# Patient Record
Sex: Female | Born: 1990 | Race: White | Hispanic: No | Smoking: Never smoker
Health system: Southern US, Community
[De-identification: ages and names within clinical notes are randomized; demographics above are authoritative.]

## PROBLEM LIST (undated history)

## (undated) DIAGNOSIS — L4050 Arthropathic psoriasis, unspecified: Secondary | ICD-10-CM

## (undated) HISTORY — PX: ROUX-EN-Y GASTRIC BYPASS: SHX1104

## (undated) HISTORY — PX: FOOT SURGERY: SHX648

---

## 2018-05-31 ENCOUNTER — Encounter (HOSPITAL_BASED_OUTPATIENT_CLINIC_OR_DEPARTMENT_OTHER): Payer: Self-pay | Admitting: Emergency Medicine

## 2018-05-31 ENCOUNTER — Emergency Department (HOSPITAL_BASED_OUTPATIENT_CLINIC_OR_DEPARTMENT_OTHER)
Admission: EM | Admit: 2018-05-31 | Discharge: 2018-05-31 | Disposition: A | Discharge status: Home / Self Care | Payer: Self-pay | Attending: Emergency Medicine | Admitting: Emergency Medicine

## 2018-05-31 ENCOUNTER — Emergency Department (HOSPITAL_BASED_OUTPATIENT_CLINIC_OR_DEPARTMENT_OTHER): Payer: Self-pay

## 2018-05-31 ENCOUNTER — Other Ambulatory Visit: Payer: Self-pay

## 2018-05-31 DIAGNOSIS — R059 Cough, unspecified: Secondary | ICD-10-CM

## 2018-05-31 DIAGNOSIS — R1013 Epigastric pain: Secondary | ICD-10-CM | POA: Insufficient documentation

## 2018-05-31 DIAGNOSIS — A599 Trichomoniasis, unspecified: Secondary | ICD-10-CM | POA: Insufficient documentation

## 2018-05-31 DIAGNOSIS — R110 Nausea: Secondary | ICD-10-CM | POA: Insufficient documentation

## 2018-05-31 DIAGNOSIS — R05 Cough: Secondary | ICD-10-CM | POA: Insufficient documentation

## 2018-05-31 LAB — URINALYSIS, MICROSCOPIC (REFLEX): WBC, UA: >50 WBC/hpf (ref 0–5)

## 2018-05-31 LAB — URINALYSIS, ROUTINE W REFLEX MICROSCOPIC
Bilirubin Urine: NEGATIVE
GLUCOSE, UA: NEGATIVE mg/dL
Hgb urine dipstick: NEGATIVE
KETONES UR: 15 mg/dL — AB
Nitrite: NEGATIVE
PH: 6 (ref 5.0–8.0)
PROTEIN: NEGATIVE mg/dL
Specific Gravity, Urine: >1.03 — ABNORMAL HIGH (ref 1.005–1.030)

## 2018-05-31 LAB — CBC WITH DIFFERENTIAL/PLATELET
Abs Immature Granulocytes: 0.03 10*3/uL (ref 0.00–0.07)
Basophils Absolute: 0 10*3/uL (ref 0.0–0.1)
Basophils Relative: 0 %
Eosinophils Absolute: 0 10*3/uL (ref 0.0–0.5)
Eosinophils Relative: 0 %
HCT: 35.3 % — ABNORMAL LOW (ref 36.0–46.0)
Hemoglobin: 10.4 g/dL — ABNORMAL LOW (ref 12.0–15.0)
Immature Granulocytes: 0 %
Lymphocytes Relative: 13 %
Lymphs Abs: 1 10*3/uL (ref 0.7–4.0)
MCH: 24.9 pg — ABNORMAL LOW (ref 26.0–34.0)
MCHC: 29.5 g/dL — ABNORMAL LOW (ref 30.0–36.0)
MCV: 84.7 fL (ref 80.0–100.0)
Monocytes Absolute: 0.5 10*3/uL (ref 0.1–1.0)
Monocytes Relative: 7 %
Neutro Abs: 5.9 10*3/uL (ref 1.7–7.7)
Neutrophils Relative %: 80 %
Platelets: 179 10*3/uL (ref 150–400)
RBC: 4.17 MIL/uL (ref 3.87–5.11)
RDW: 14.7 % (ref 11.5–15.5)
WBC: 7.4 10*3/uL (ref 4.0–10.5)
nRBC: 0 % (ref 0.0–0.2)

## 2018-05-31 LAB — COMPREHENSIVE METABOLIC PANEL
ALT: 17 U/L (ref 0–44)
AST: 21 U/L (ref 15–41)
Albumin: 3.7 g/dL (ref 3.5–5.0)
Alkaline Phosphatase: 59 U/L (ref 38–126)
Anion gap: 7 (ref 5–15)
BUN: 12 mg/dL (ref 6–20)
CO2: 23 mmol/L (ref 22–32)
CREATININE: 0.46 mg/dL (ref 0.44–1.00)
Calcium: 8.4 mg/dL — ABNORMAL LOW (ref 8.9–10.3)
Chloride: 104 mmol/L (ref 98–111)
GFR calc Af Amer: >60 mL/min (ref >60)
GFR calc non Af Amer: >60 mL/min (ref >60)
Glucose, Bld: 107 mg/dL — ABNORMAL HIGH (ref 70–99)
Potassium: 3.8 mmol/L (ref 3.5–5.1)
Sodium: 134 mmol/L — ABNORMAL LOW (ref 135–145)
Total Bilirubin: 1.1 mg/dL (ref 0.3–1.2)
Total Protein: 7.2 g/dL (ref 6.5–8.1)

## 2018-05-31 LAB — LIPASE, BLOOD: Lipase: 26 U/L (ref 11–51)

## 2018-05-31 LAB — WET PREP, GENITAL
SPERM: NONE SEEN
YEAST WET PREP: NONE SEEN

## 2018-05-31 LAB — PREGNANCY, URINE: Preg Test, Ur: NEGATIVE

## 2018-05-31 MED ORDER — SODIUM CHLORIDE 0.9 % IV BOLUS
1000.0000 mL | Freq: Once | INTRAVENOUS | Status: AC
  Administered 2018-05-31: 1000 mL via INTRAVENOUS

## 2018-05-31 MED ORDER — METRONIDAZOLE 500 MG PO TABS
2000.0000 mg | ORAL_TABLET | Freq: Once | ORAL | Status: AC
  Administered 2018-05-31: 2000 mg via ORAL
  Filled 2018-05-31: qty 4

## 2018-05-31 MED ORDER — ONDANSETRON HCL 4 MG/2ML IJ SOLN
4.0000 mg | Freq: Once | INTRAMUSCULAR | Status: AC
  Administered 2018-05-31: 4 mg via INTRAVENOUS
  Filled 2018-05-31: qty 2

## 2018-05-31 MED ORDER — DICYCLOMINE HCL 20 MG PO TABS: 20.0000 mg | ORAL_TABLET | Freq: Three times a day (TID) | ORAL | 0 refills | Status: AC | PRN

## 2018-05-31 MED ORDER — ONDANSETRON 4 MG PO TBDP: 4.0000 mg | ORAL_TABLET | Freq: Three times a day (TID) | ORAL | 0 refills | Status: AC | PRN

## 2018-05-31 MED ORDER — ACETAMINOPHEN 500 MG PO TABS
1000.0000 mg | ORAL_TABLET | Freq: Once | ORAL | Status: AC
  Administered 2018-05-31: 1000 mg via ORAL
  Filled 2018-05-31: qty 2

## 2018-05-31 NOTE — ED Notes (Signed)
Pt verbalizes understanding of d/c instructions and denies any further needs at this time. 

## 2018-05-31 NOTE — ED Notes (Signed)
Dr. Jacqulyn Bath states it is ok for pt to take her own hydrocodone

## 2018-05-31 NOTE — ED Provider Notes (Signed)
Emergency Department Provider Note   I have reviewed the triage vital signs and the nursing notes.   HISTORY  Chief Complaint Dehydration   HPI Carol Mcneil is a 28 y.o. female with PMH of Roux-en-Y gastric bypass in 2017 presents to the emergency department with fatigue, cough, fever, epigastric pain, nausea.  Patient has not experienced any vomiting or diarrhea.  Symptoms are mainly with coughing.  She has had mild headache but no sore throat.  No known flu contacts.  Her abdominal pain is epigastric and left upper quadrant.  No radiation of symptoms.  No complications from her gastric bypass to date.  Tums have been intermittent for the past 2 weeks but worsening significantly over the past 2 days.  Denies any lower abdominal pain, vaginal discharge, vaginal bleeding.  She does note frequent itching in the vaginal area.    History reviewed. No pertinent past medical history.  There are no active problems to display for this patient.  Allergies Other and Vancomycin  History reviewed. No pertinent family history.  Social History Social History   Tobacco Use  . Smoking status: Not on file  Substance Use Topics  . Alcohol use: Not on file  . Drug use: Not on file    Review of Systems  Constitutional: Postive fever and fatigue.  Eyes: No visual changes. ENT: Mild sore throat. Cardiovascular: Denies chest pain. Respiratory: Denies shortness of breath. Positive cough.  Gastrointestinal: Positive epigastric abdominal pain.  Positive nausea, no vomiting.  No diarrhea.  No constipation. Genitourinary: Negative for dysuria. Positive vaginal itching.  Musculoskeletal: Negative for back pain. Skin: Negative for rash. Neurological: Negative for focal weakness or numbness. Positive HA.   10-point ROS otherwise negative.  ____________________________________________   PHYSICAL EXAM:  VITAL SIGNS: ED Triage Vitals  Enc Vitals Group     BP 05/31/18 1855 (!) 151/97   Pulse Rate 05/31/18 1855 (!) 128     Resp 05/31/18 1855 20     Temp 05/31/18 1855 99.2 F (37.3 C)     Temp Source 05/31/18 1855 Oral     SpO2 05/31/18 1855 100 %     Weight 05/31/18 1854 192 lb (87.1 kg)     Height 05/31/18 1854 5\' 4"  (1.626 m)     Pain Score 05/31/18 1858 8   Constitutional: Alert and oriented. Well appearing and in no acute distress. Eyes: Conjunctivae are normal. PERRL.  Head: Atraumatic. Nose: Mild congestion/rhinnorhea. Mouth/Throat: Mucous membranes are moist.  Oropharynx non-erythematous. Neck: No stridor.  No meningeal signs. Cardiovascular: Tachycardia. Good peripheral circulation. Grossly normal heart sounds.   Respiratory: Normal respiratory effort.  No retractions. Lungs CTAB. Gastrointestinal: Soft with mild epigastric tenderness. No distention.  Genitourinary: Pelvic exam performed with nurse chaperone. Moderate discharge. No CMT or adnexal tenderness/fullness.  Musculoskeletal: No lower extremity tenderness nor edema. No gross deformities of extremities. Neurologic:  Normal speech and language. No gross focal neurologic deficits are appreciated.  Skin:  Skin is warm, dry and intact. No rash noted.   ____________________________________________   LABS (all labs ordered are listed, but only abnormal results are displayed)  Labs Reviewed  WET PREP, GENITAL - Abnormal; Notable for the following components:      Result Value   Trich, Wet Prep PRESENT (*)    Clue Cells Wet Prep HPF POC PRESENT (*)    WBC, Wet Prep HPF POC MANY (*)    All other components within normal limits  URINALYSIS, ROUTINE W REFLEX MICROSCOPIC - Abnormal; Notable  for the following components:   APPearance CLOUDY (*)    Specific Gravity, Urine >1.030 (*)    Ketones, ur 15 (*)    Leukocytes,Ua MODERATE (*)    All other components within normal limits  URINALYSIS, MICROSCOPIC (REFLEX) - Abnormal; Notable for the following components:   Bacteria, UA FEW (*)    Trichomonas, UA  PRESENT (*)    All other components within normal limits  COMPREHENSIVE METABOLIC PANEL - Abnormal; Notable for the following components:   Sodium 134 (*)    Glucose, Bld 107 (*)    Calcium 8.4 (*)    All other components within normal limits  CBC WITH DIFFERENTIAL/PLATELET - Abnormal; Notable for the following components:   Hemoglobin 10.4 (*)    HCT 35.3 (*)    MCH 24.9 (*)    MCHC 29.5 (*)    All other components within normal limits  URINE CULTURE  PREGNANCY, URINE  LIPASE, BLOOD  HIV ANTIBODY (ROUTINE TESTING W REFLEX)  GC/CHLAMYDIA PROBE AMP ([place]) NOT AT [facility]   ____________________________________________  RADIOLOGY  Dg Chest 2 View  Result Date: 05/31/2018 CLINICAL DATA:  Cough, fever EXAM: CHEST - 2 VIEW COMPARISON:  None. FINDINGS: The heart size and mediastinal contours are within normal limits. Both lungs are clear. The visualized skeletal structures are unremarkable. IMPRESSION: No active cardiopulmonary disease. Electronically Signed   By: Charlett Nose M.D.   On: 05/31/2018 21:14    ____________________________________________   PROCEDURES  Procedure(s) performed:   Procedures  None ____________________________________________   INITIAL IMPRESSION / ASSESSMENT AND PLAN / ED COURSE  Pertinent labs & imaging results that were available during my care of the patient were reviewed by me and considered in my medical decision making (see chart for details).  Patient presents to the emergency department with epigastric abdominal pain, nausea, cough.  Borderline fever here with mild tachycardia.  Patient has a nontender abdominal exam.  Urinalysis shows possible infection but also trichomonas.  I discussed this with the patient with her partner outside of the room.  She reports of frequent BV infections but no prior history of trichomonas.  Plan for pelvic exam.  Following chest x-ray and will reassess.   Chest x-ray with no acute infiltrate.  Patient does  have mild tachycardia and fever but no concern clinically for sepsis.  UA shows moderate leukocytes and bacteria but suspect that this is from her active trichomonas infection which was also found on UA.  Pelvic exam completed which is consistent with trichomonas on wet prep.  No cervical motion tenderness or concern for pelvic inflammatory disease or tubo-ovarian abscess.  The patient's abdomen is soft and nontender.  The patient has not having any UTI symptoms such as dysuria, hesitancy, urgency.  I offered treatment for gonorrhea and chlamydia but patient would like to wait on testing.  She informed her fianc, who is in the room, and advised that he be treated as well. Low suspicion for flu and patient is outside the window to benefit from Tamiflu.  ____________________________________________  FINAL CLINICAL IMPRESSION(S) / ED DIAGNOSES  Final diagnoses:  Nausea  Epigastric abdominal pain  Cough  Trichimoniasis     MEDICATIONS GIVEN DURING THIS VISIT:  Medications  sodium chloride 0.9 % bolus 1,000 mL (0 mLs Intravenous Stopped 05/31/18 2135)  ondansetron (ZOFRAN) injection 4 mg (4 mg Intravenous Given 05/31/18 2027)  metroNIDAZOLE (FLAGYL) tablet 2,000 mg (2,000 mg Oral Given 05/31/18 2043)  acetaminophen (TYLENOL) tablet 1,000 mg (1,000 mg Oral Given  05/31/18 2043)     NEW OUTPATIENT MEDICATIONS STARTED DURING THIS VISIT:  Discharge Medication List as of 05/31/2018  9:33 PM    START taking these medications   Details  dicyclomine (BENTYL) 20 MG tablet Take 1 tablet (20 mg total) by mouth 3 (three) times daily as needed for spasms., Starting Wed 05/31/2018, Print    ondansetron (ZOFRAN ODT) 4 MG disintegrating tablet Take 1 tablet (4 mg total) by mouth every 8 (eight) hours as needed for nausea or vomiting., Starting Wed 05/31/2018, Print        Note:  This document was prepared using Dragon voice recognition software and may include unintentional dictation errors.  Alona BeneJoshua  Reda Gettis, MD Emergency Medicine    Gennett Garcia, Arlyss RepressJoshua G, MD 06/01/18 1024

## 2018-05-31 NOTE — Discharge Instructions (Signed)

## 2018-05-31 NOTE — ED Notes (Signed)
Pt does not have her hydrocodone, will give tylenol instead

## 2018-05-31 NOTE — ED Triage Notes (Signed)
Reports history of gastric bypass surgery.  States she believes she is dehydrated.  States she is nauseated when she tries to drink.

## 2018-06-01 LAB — GC/CHLAMYDIA PROBE AMP (~~LOC~~) NOT AT ARMC
Chlamydia: NEGATIVE
Neisseria Gonorrhea: NEGATIVE

## 2018-06-02 LAB — HIV ANTIBODY (ROUTINE TESTING W REFLEX): HIV Screen 4th Generation wRfx: NONREACTIVE

## 2018-06-02 LAB — URINE CULTURE: Culture: >=100000 — AB

## 2018-06-03 ENCOUNTER — Telehealth: Payer: Self-pay | Admitting: Emergency Medicine

## 2018-06-03 ENCOUNTER — Telehealth (HOSPITAL_COMMUNITY): Payer: Self-pay | Admitting: Pharmacist

## 2018-06-03 NOTE — Progress Notes (Signed)
ED Antimicrobial Stewardship Positive Culture Follow Up   Carol Mcneil is an 28 y.o. female who presented to [facility] ED on 05/31/18 with a chief complaint of nausea, cough, fever, and epigastric pain.  Recent Results (from the past 720 hour(s))  Urine culture     Status: Abnormal   Collection Time: 05/31/18  7:14 PM  Result Value Ref Range Status   Specimen Description   Final    URINE, CLEAN CATCH Performed at [facility], [address] [place], [place] [address]    Special Requests   Final    NONE Performed at [facility], [address] [place], [place] [address]    Culture (A)  Final    >=100,000 COLONIES/mL GROUP B STREP(S.AGALACTIAE)ISOLATED TESTING AGAINST S. AGALACTIAE NOT ROUTINELY PERFORMED DUE TO PREDICTABILITY OF AMP/PEN/VAN SUSCEPTIBILITY. Performed at [facility], [address] [place], [place] [address]    Report Status 06/02/2018 FINAL  Final  Wet prep, genital     Status: Abnormal   Collection Time: 05/31/18  8:59 PM  Result Value Ref Range Status   Yeast Wet Prep HPF POC NONE SEEN NONE SEEN Final   Trich, Wet Prep PRESENT (A) NONE SEEN Final   Clue Cells Wet Prep HPF POC PRESENT (A) NONE SEEN Final   WBC, Wet Prep HPF POC MANY (A) NONE SEEN Final   Sperm NONE SEEN  Final    Comment: Performed at [facility], [address] [place], [place] [address]    Treated with Flagyl for + Trich on Wet Prep.   [x]  Patient discharged originally without antimicrobial agent and treatment is now indicated  New antibiotic prescription: Amoxicillin 500mg  po TID x7 days  ED Provider: Bennett Scrape, PA-C   Carol Mcneil 06/03/2018, 10:00 AM Clinical Pharmacist Monday - Friday phone -  [phone] Saturday - Sunday phone - [phone]

## 2018-06-03 NOTE — Telephone Encounter (Signed)
Post ED Visit - Positive Culture Follow-up: Successful Patient Follow-Up  Culture assessed and recommendations reviewed by:  []  Enzo Bi, Pharm.D. []  Celedonio Miyamoto, Pharm.D., BCPS AQ-ID []  Garvin Fila, Pharm.D., BCPS []  Georgina Pillion, Pharm.D., BCPS []  [place], [address].D., BCPS, AAHIVP []  Estella Husk, Pharm.D., BCPS, AAHIVP []  Lysle Pearl, PharmD, BCPS []  Phillips Climes, PharmD, BCPS []  Agapito Games, PharmD, BCPS []  Verlan Friends, PharmD Link Snuffer PharmD  Positive urine culture  [x]  Patient discharged without antimicrobial prescription and treatment is now indicated []  Organism is resistant to prescribed ED discharge antimicrobial []  Patient with positive blood cultures  Changes discussed with ED provider: K. Albrizze PA New antibiotic prescription start Amoxicillin 500mg  po q 8 hours x 7 days  Attempting to contact patient   Berle Mull 06/03/2018, 2:37 PM

## 2018-06-09 ENCOUNTER — Telehealth: Payer: Self-pay

## 2018-06-09 NOTE — Telephone Encounter (Addendum)
Post ED Visit - Positive Culture Follow-up: Successful Patient Follow-Up  Culture assessed and recommendations reviewed by:  []  Enzo Bi, Pharm.D. []  Celedonio Miyamoto, Pharm.D., BCPS AQ-ID []  Garvin Fila, Pharm.D., BCPS []  Georgina Pillion, [address].D., BCPS []  [place], [address].D., BCPS, AAHIVP []  Estella Husk, Pharm.D., BCPS, AAHIVP []  Lysle Pearl, PharmD, BCPS []  Phillips Climes, PharmD, BCPS []  Agapito Games, PharmD, BCPS []  Verlan Friends, PharmD Pharm D Positive urne culture  [x]  Patient discharged without antimicrobial prescription and treatment is now indicated []  Organism is resistant to prescribed ED discharge antimicrobial []  Patient with positive blood cultures  Changes discussed with ED provider: Doug Sou [facility] New antibiotic prescription Amoxicillin 500 mg Q8hr x 7 days Called to [facility] [address] [phone]  Contacted patient, date 06/09/2018, time 1545 Pt did call back after looking up Group B strep and was concerned that with her "stiff neck" for over a month that Group B strep could be a sign of meningitis.  Instructed that abx had been called and that if she needs to return to ED to do so.   Jerry Caras 06/09/2018, 3:43 PM

## 2018-09-08 ENCOUNTER — Other Ambulatory Visit: Payer: Self-pay

## 2018-09-08 ENCOUNTER — Emergency Department (HOSPITAL_BASED_OUTPATIENT_CLINIC_OR_DEPARTMENT_OTHER)
Admission: EM | Admit: 2018-09-08 | Discharge: 2018-09-08 | Disposition: A | Discharge status: Home / Self Care | Payer: Self-pay | Attending: Emergency Medicine | Admitting: Emergency Medicine

## 2018-09-08 ENCOUNTER — Encounter (HOSPITAL_BASED_OUTPATIENT_CLINIC_OR_DEPARTMENT_OTHER): Payer: Self-pay | Admitting: Emergency Medicine

## 2018-09-08 DIAGNOSIS — R51 Headache: Secondary | ICD-10-CM | POA: Insufficient documentation

## 2018-09-08 DIAGNOSIS — R519 Headache, unspecified: Secondary | ICD-10-CM

## 2018-09-08 DIAGNOSIS — L4050 Arthropathic psoriasis, unspecified: Secondary | ICD-10-CM | POA: Insufficient documentation

## 2018-09-08 DIAGNOSIS — M542 Cervicalgia: Secondary | ICD-10-CM | POA: Insufficient documentation

## 2018-09-08 HISTORY — DX: Arthropathic psoriasis, unspecified: L40.50

## 2018-09-08 LAB — PREGNANCY, URINE: Preg Test, Ur: NEGATIVE

## 2018-09-08 MED ORDER — PROCHLORPERAZINE EDISYLATE 10 MG/2ML IJ SOLN
10.0000 mg | Freq: Once | INTRAMUSCULAR | Status: AC
  Administered 2018-09-08: 10 mg via INTRAVENOUS
  Filled 2018-09-08: qty 2

## 2018-09-08 MED ORDER — SODIUM CHLORIDE 0.9 % IV SOLN
INTRAVENOUS | Status: DC | PRN
  Administered 2018-09-08: 250 mL via INTRAVENOUS

## 2018-09-08 MED ORDER — SODIUM CHLORIDE 0.9 % IV BOLUS
1000.0000 mL | Freq: Once | INTRAVENOUS | Status: AC
  Administered 2018-09-08: 1000 mL via INTRAVENOUS

## 2018-09-08 MED ORDER — MAGNESIUM SULFATE 2 GM/50ML IV SOLN
2.0000 g | Freq: Once | INTRAVENOUS | Status: AC
  Administered 2018-09-08: 2 g via INTRAVENOUS
  Filled 2018-09-08: qty 50

## 2018-09-08 MED ORDER — DIPHENHYDRAMINE HCL 50 MG/ML IJ SOLN
25.0000 mg | Freq: Once | INTRAMUSCULAR | Status: AC
  Administered 2018-09-08: 25 mg via INTRAVENOUS
  Filled 2018-09-08: qty 1

## 2018-09-08 MED ORDER — KETOROLAC TROMETHAMINE 30 MG/ML IJ SOLN
30.0000 mg | Freq: Once | INTRAMUSCULAR | Status: AC
  Administered 2018-09-08: 30 mg via INTRAVENOUS
  Filled 2018-09-08: qty 1

## 2018-09-08 MED ORDER — DROPERIDOL 2.5 MG/ML IJ SOLN
2.5000 mg | Freq: Once | INTRAMUSCULAR | Status: AC
  Administered 2018-09-08: 2.5 mg via INTRAVENOUS
  Filled 2018-09-08: qty 2

## 2018-09-08 NOTE — ED Provider Notes (Signed)
[facility] EMERGENCY DEPARTMENT Provider Note   CSN: [id number] Arrival date & time: 09/08/18  1123    History   Chief Complaint Chief Complaint  Patient presents with  . Neck Pain  . Headache    HPI Carol Mcneil is a 28 y.o. female.     28 year old female with past medical history including Roux-en-Y gastric bypass, psoriatic arthritis on Remicade, chronic pain on chronic opiates who presents with neck pain and headache.  Patient reports 1 week of persistent left-sided neck pain that sometimes radiates up into her head and gives her a headache.  She denies any trauma or change in physical activity recently to provoke the neck pain.  There is a certain area along the muscles on the left side of her neck that if she pushes on it, it temporarily feels better.  She reports history of migraines but states that this is different because none of the medication she is taking are helping.  She follows with a pain clinic and yesterday they started a steroid taper.  She has also been on gabapentin recently with no relief of symptoms.  She notes that she is currently out of hydrocodone and will not be able to fill the prescription until the end of the month. She reports nausea and sometimes blurry vision. No diplopia, vomiting, fevers, or recent illness. No weakness.  The history is provided by the patient.  Neck Pain  Associated symptoms: headaches   Headache  Associated symptoms: neck pain     Past Medical History:  Diagnosis Date  . Psoriatic arthritis (HCC)     There are no active problems to display for this patient.   Past Surgical History:  Procedure Laterality Date  . FOOT SURGERY Left   . ROUX-EN-Y GASTRIC BYPASS       OB History   No obstetric history on file.      Home Medications    Prior to Admission medications   Medication Sig Start Date End Date Taking? Authorizing Provider  dicyclomine (BENTYL) 20 MG tablet Take 1 tablet (20 mg total) by mouth 3  (three) times daily as needed for spasms. 05/31/18   Long, Arlyss Repress, MD  HYDROcodone-acetaminophen (NORCO/VICODIN) 5-325 MG tablet Take 1 tablet by mouth every 6 (six) hours as needed for moderate pain.    [provider]  ondansetron (ZOFRAN ODT) 4 MG disintegrating tablet Take 1 tablet (4 mg total) by mouth every 8 (eight) hours as needed for nausea or vomiting. 05/31/18   Long, Arlyss Repress, MD    Family History No family history on file.  Social History Social History   Tobacco Use  . Smoking status: Never Smoker  . Smokeless tobacco: Never Used  Substance Use Topics  . Alcohol use: Yes    Comment: rarely   . Drug use: Never     Allergies   Other and Vancomycin   Review of Systems Review of Systems  Musculoskeletal: Positive for neck pain.  Neurological: Positive for headaches.   All other systems reviewed and are negative except that which was mentioned in HPI   Physical Exam Updated Vital Signs BP (!) 144/86 (BP Location: Right Arm)   Pulse 64   Temp 98.2 F (36.8 C) (Oral)   Resp 16   Ht 5\' 4"  (1.626 m)   Wt 87.5 kg   LMP 09/01/2018   SpO2 100%   BMI 33.13 kg/m   Physical Exam Vitals signs and nursing note reviewed.  Constitutional:  General: She is not in acute distress.    Appearance: She is well-developed.     Comments: Awake, alert  HENT:     Head: Normocephalic and atraumatic.  Eyes:     Extraocular Movements: Extraocular movements intact.     Conjunctiva/sclera: Conjunctivae normal.     Pupils: Pupils are equal, round, and reactive to light.  Neck:     Musculoskeletal: Normal range of motion and neck supple.     Comments: No focal areas of tenderness Pulmonary:     Effort: Pulmonary effort is normal.  Musculoskeletal:        General: No swelling.  Skin:    General: Skin is warm and dry.  Neurological:     Mental Status: She is alert and oriented to person, place, and time.     Cranial Nerves: No cranial nerve deficit.      Sensory: No sensory deficit.     Motor: No weakness or abnormal muscle tone.     Coordination: Coordination normal.     Deep Tendon Reflexes: Reflexes are normal and symmetric.     Comments: Fluent speech, normal finger-to-nose testing, negative pronator drift, no clonus 5/5 strength and normal sensation x all 4 extremities  Psychiatric:        Speech: Speech normal.        Behavior: Behavior normal.        Thought Content: Thought content normal.        Judgment: Judgment normal.      ED Treatments / Results  Labs (all labs ordered are listed, but only abnormal results are displayed) Labs Reviewed  PREGNANCY, URINE    EKG None  Radiology No results found.  Procedures Procedures (including critical care time)  Medications Ordered in ED Medications  0.9 %  sodium chloride infusion ( Intravenous Stopped 09/08/18 1224)  sodium chloride 0.9 % bolus 1,000 mL ( Intravenous Stopped 09/08/18 1318)  diphenhydrAMINE (BENADRYL) injection 25 mg (25 mg Intravenous Given 09/08/18 1215)  prochlorperazine (COMPAZINE) injection 10 mg (10 mg Intravenous Given 09/08/18 1218)  magnesium sulfate IVPB 2 g 50 mL ( Intravenous Stopped 09/08/18 1318)  ketorolac (TORADOL) 30 MG/ML injection 30 mg (30 mg Intravenous Given 09/08/18 1359)  droperidol (INAPSINE) 2.5 MG/ML injection 2.5 mg (2.5 mg Intravenous Given 09/08/18 1357)     Initial Impression / Assessment and Plan / ED Course  I have reviewed the triage vital signs and the nursing notes.  Pertinent labs  that were available during my care of the patient were reviewed by me and considered in my medical decision making (see chart for details).        Pt reports chronic problems with neck related to psoriatic arthritis and follows w/ pain clinic for the same. She has no evidence of weakness to suggest acute cervical spine process such as impingement, no infectious sx to suggest discitis, and no concerning features to suggest vertebral artery  dissection, SAH, or other vascular problem. Gave migraine cocktail. Explained that I would not be able to provide narcotics for this problem. I reviewed notes from pain clinic visit yesterday.  After receiving above medications, the patient was well-appearing and voiced improvement in pain on reassessment.  I have recommended that she contact pain clinic for follow-up appointment and continue all home medications until that time.  Return precautions reviewed and she voiced understanding.  Final Clinical Impressions(s) / ED Diagnoses   Final diagnoses:  Neck pain  Bad headache    ED Discharge Orders  None       Benney Somm[place], Ambrose Finland, MD 09/08/18 [phone]

## 2018-09-08 NOTE — ED Notes (Signed)
Pt amb to [facility]

## 2018-09-08 NOTE — ED Triage Notes (Signed)
Pt c/o LT side neck pain with no injury x 1 wk; HA on/off x 1 wk

## 2020-05-18 IMAGING — DX DG CHEST 2V
2 series · 2 of 2 positions shown · non-contrast
Comparison: None.

CLINICAL DATA: Cough, fever

EXAM:
CHEST - 2 VIEW

[chest pa]
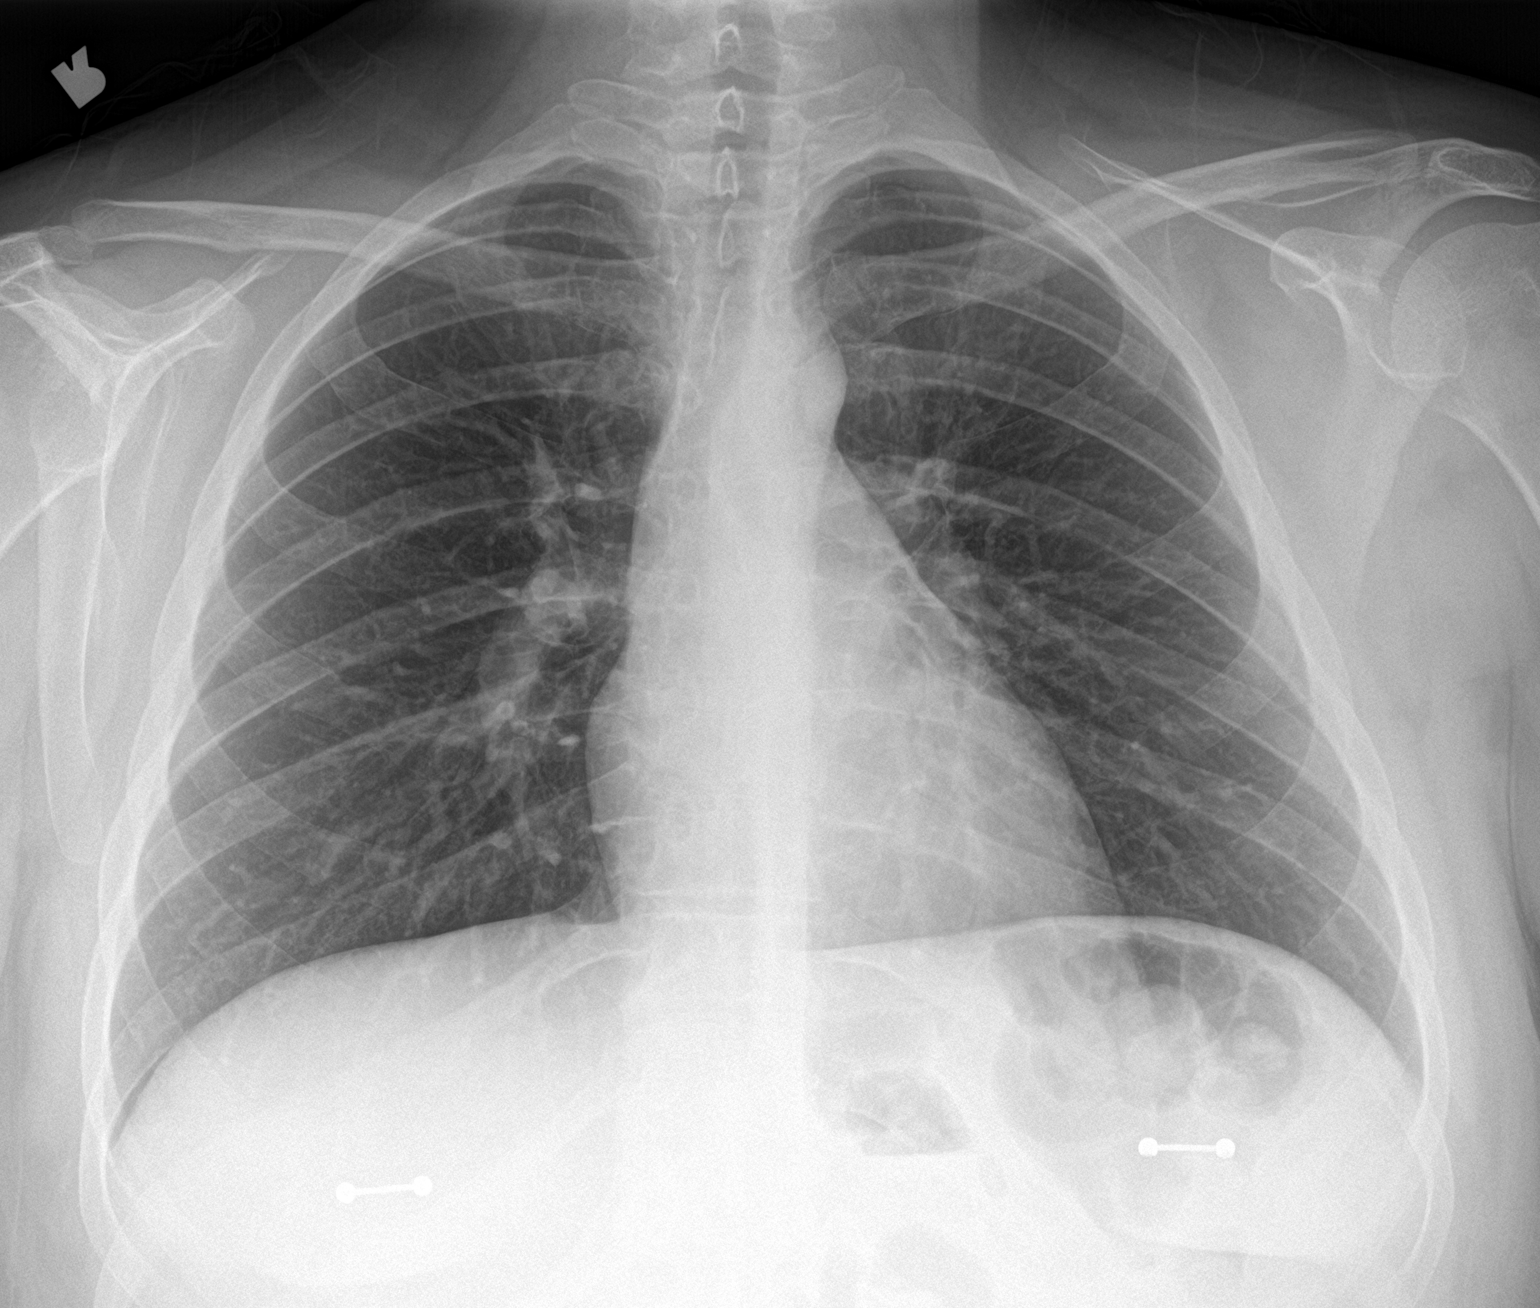

[chest lat]
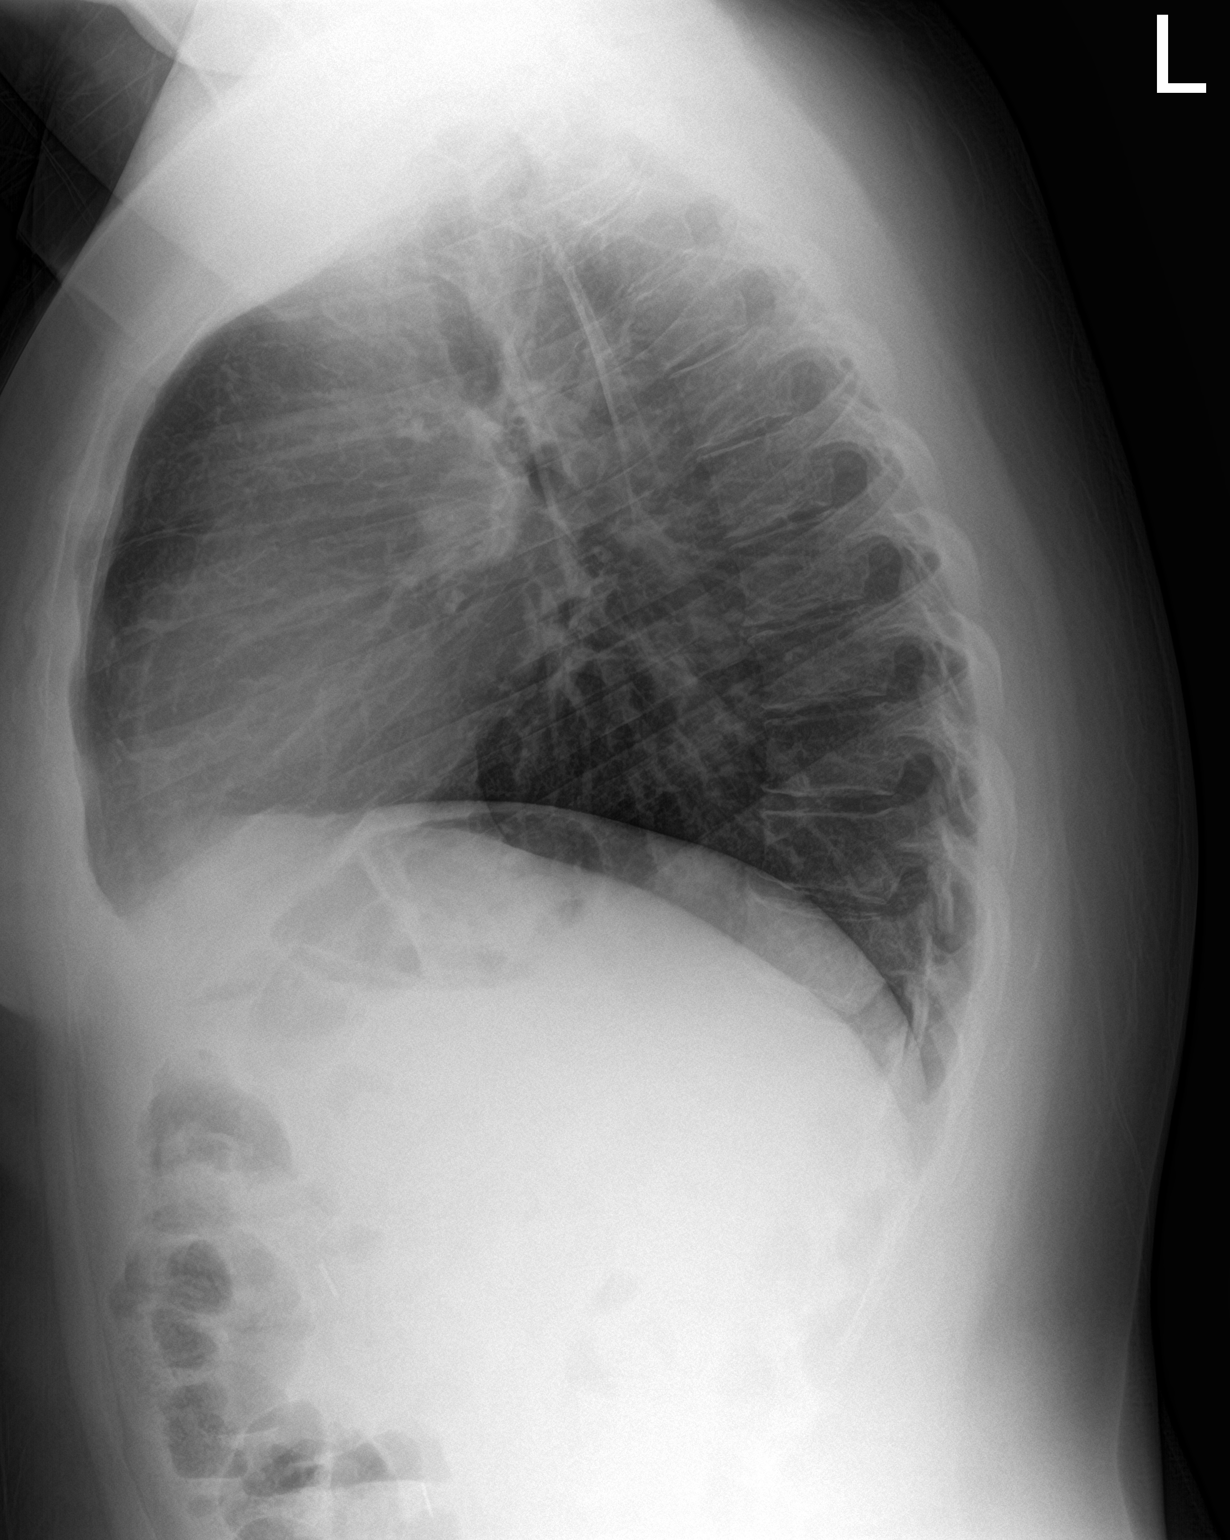

[2 of 2 positions shown; findings below may reference images not displayed]

FINDINGS: The heart size and mediastinal contours are within normal limits.
Both lungs are clear. The visualized skeletal structures are
unremarkable.
IMPRESSION: No active cardiopulmonary disease.
